# Patient Record
Sex: Female | Born: 1975 | Hispanic: No | Marital: Married
Health system: Southern US, Community
[De-identification: ages and names within clinical notes are randomized; demographics above are authoritative.]

---

## 2016-07-10 ENCOUNTER — Other Ambulatory Visit (HOSPITAL_COMMUNITY)
Admission: RE | Admit: 2016-07-10 | Discharge: 2016-07-10 | Disposition: A | Discharge status: Home / Self Care | Payer: 59 | Source: Ambulatory Visit | Attending: Obstetrics & Gynecology | Admitting: Obstetrics & Gynecology

## 2016-07-10 ENCOUNTER — Other Ambulatory Visit: Payer: Self-pay | Admitting: Obstetrics & Gynecology

## 2016-07-10 DIAGNOSIS — Z01419 Encounter for gynecological examination (general) (routine) without abnormal findings: Secondary | ICD-10-CM | POA: Diagnosis present

## 2016-07-10 DIAGNOSIS — Z1151 Encounter for screening for human papillomavirus (HPV): Secondary | ICD-10-CM | POA: Diagnosis not present

## 2016-07-11 ENCOUNTER — Ambulatory Visit: Payer: Self-pay | Admitting: Physician Assistant

## 2016-07-15 LAB — CYTOLOGY - PAP
DIAGNOSIS: NEGATIVE
HPV: NOT DETECTED

## 2017-05-27 ENCOUNTER — Encounter: Payer: Self-pay | Admitting: Physician Assistant

## 2019-03-17 ENCOUNTER — Other Ambulatory Visit: Payer: Self-pay | Admitting: Gastroenterology

## 2019-03-17 DIAGNOSIS — K529 Noninfective gastroenteritis and colitis, unspecified: Secondary | ICD-10-CM

## 2019-03-17 DIAGNOSIS — R140 Abdominal distension (gaseous): Secondary | ICD-10-CM

## 2019-03-17 DIAGNOSIS — R634 Abnormal weight loss: Secondary | ICD-10-CM

## 2019-03-17 DIAGNOSIS — R1010 Upper abdominal pain, unspecified: Secondary | ICD-10-CM

## 2019-03-24 ENCOUNTER — Ambulatory Visit
Admission: RE | Admit: 2019-03-24 | Discharge: 2019-03-24 | Disposition: A | Discharge status: Home / Self Care | Payer: Managed Care, Other (non HMO) | Source: Ambulatory Visit | Attending: Gastroenterology | Admitting: Gastroenterology

## 2019-03-24 DIAGNOSIS — K529 Noninfective gastroenteritis and colitis, unspecified: Secondary | ICD-10-CM

## 2019-03-24 DIAGNOSIS — R140 Abdominal distension (gaseous): Secondary | ICD-10-CM

## 2019-03-24 DIAGNOSIS — R1010 Upper abdominal pain, unspecified: Secondary | ICD-10-CM

## 2019-03-24 DIAGNOSIS — R634 Abnormal weight loss: Secondary | ICD-10-CM

## 2021-07-01 ENCOUNTER — Other Ambulatory Visit: Payer: Self-pay | Admitting: Family Medicine

## 2021-07-01 DIAGNOSIS — Z1231 Encounter for screening mammogram for malignant neoplasm of breast: Secondary | ICD-10-CM

## 2021-07-09 ENCOUNTER — Ambulatory Visit
Admission: RE | Admit: 2021-07-09 | Discharge: 2021-07-09 | Disposition: A | Discharge status: Home / Self Care | Payer: Managed Care, Other (non HMO) | Source: Ambulatory Visit | Attending: Family Medicine | Admitting: Family Medicine

## 2021-07-09 DIAGNOSIS — Z1231 Encounter for screening mammogram for malignant neoplasm of breast: Secondary | ICD-10-CM

## 2021-07-11 ENCOUNTER — Ambulatory Visit: Payer: Managed Care, Other (non HMO)

## 2021-09-19 ENCOUNTER — Ambulatory Visit: Payer: Managed Care, Other (non HMO) | Admitting: Orthopaedic Surgery

## 2021-09-19 ENCOUNTER — Ambulatory Visit: Payer: Self-pay

## 2021-09-19 ENCOUNTER — Ambulatory Visit (INDEPENDENT_AMBULATORY_CARE_PROVIDER_SITE_OTHER): Payer: Managed Care, Other (non HMO)

## 2021-09-19 DIAGNOSIS — M25561 Pain in right knee: Secondary | ICD-10-CM

## 2021-09-19 DIAGNOSIS — G8929 Other chronic pain: Secondary | ICD-10-CM | POA: Insufficient documentation

## 2021-09-19 DIAGNOSIS — M25521 Pain in right elbow: Secondary | ICD-10-CM | POA: Diagnosis not present

## 2021-09-19 DIAGNOSIS — M25562 Pain in left knee: Secondary | ICD-10-CM

## 2021-09-19 MED ORDER — BUPIVACAINE HCL 0.5 % IJ SOLN
2.0000 mL | INTRAMUSCULAR | Status: AC | PRN
  Administered 2021-09-19: 2 mL via INTRA_ARTICULAR

## 2021-09-19 MED ORDER — LIDOCAINE HCL 1 % IJ SOLN
2.0000 mL | INTRAMUSCULAR | Status: AC | PRN
  Administered 2021-09-19: 2 mL

## 2021-09-19 MED ORDER — METHYLPREDNISOLONE ACETATE 40 MG/ML IJ SUSP
40.0000 mg | INTRAMUSCULAR | Status: AC | PRN
  Administered 2021-09-19: 40 mg via INTRA_ARTICULAR

## 2021-09-19 NOTE — Progress Notes (Signed)
Office Visit Note   Patient: Donna Hendricks           Date of Birth: 03-30-1975           MRN: [id number] Visit Date: 09/19/2021              Requested by: Mila Palmer, MD [address] [place],  [place] [address] PCP: Mila Palmer, MD   Assessment & Plan: Visit Diagnoses:  1. Chronic pain of both knees   2. Pain in right elbow     Plan: Impression is left knee pain and right elbow lateral epicondylitis.  For the left knee suspect that she may have a medial meniscal tear.  Not having any mechanical symptoms at this time.  We will try cortisone injection and relative rest.  Will need MRI if symptoms do not improve.  In regards to the elbow Home exercises and bands were provided.  Follow-Up Instructions: No follow-ups on file.   Orders:  Orders Placed This Encounter  Procedures   Large Joint Inj: L knee   XR KNEE 3 VIEW LEFT   XR KNEE 3 VIEW RIGHT   No orders of the defined types were placed in this encounter.     Procedures: Large Joint Inj: L knee on 09/19/2021 9:47 AM Details: 22 G needle Medications: 2 mL bupivacaine 0.5 %; 2 mL lidocaine 1 %; 40 mg methylPREDNISolone acetate 40 MG/ML Outcome: tolerated well, no immediate complications Patient was prepped and draped in the usual sterile fashion.       Clinical Data: No additional findings.   Subjective: Chief Complaint  Patient presents with   Right Knee - Pain   Left Knee - Pain    HPI Patient is a very pleasant 46 year old female here for evaluation of her right elbow pain and bilateral knee pain.  The pain is worse in the left knee along the medial joint line.  She is very active and is an avid runner.  Has popping in both knees.  Pain started in June.  Her elbow hurts when lifting a 10 pound weight.  The pain is to the lateral side of the elbow.  Denies any numbness and tingling. Review of Systems  Constitutional: Negative.   HENT: Negative.    Eyes: Negative.   Respiratory:  Negative.    Cardiovascular: Negative.   Endocrine: Negative.   Musculoskeletal: Negative.   Neurological: Negative.   Hematological: Negative.   Psychiatric/Behavioral: Negative.    All other systems reviewed and are negative.    Objective: Vital Signs: There were no vitals taken for this visit.  Physical Exam Vitals and nursing note reviewed.  Constitutional:      Appearance: She is well-developed.  HENT:     Head: Atraumatic.     Nose: Nose normal.  Eyes:     Extraocular Movements: Extraocular movements intact.  Cardiovascular:     Pulses: Normal pulses.  Pulmonary:     Effort: Pulmonary effort is normal.  Abdominal:     Palpations: Abdomen is soft.  Musculoskeletal:     Cervical back: Neck supple.  Skin:    General: Skin is warm.     Capillary Refill: Capillary refill takes less than 2 seconds.  Neurological:     Mental Status: She is alert. Mental status is at baseline.  Psychiatric:        Behavior: Behavior normal.        Thought Content: Thought content normal.  Judgment: Judgment normal.     Ortho Exam Examination of bilateral knees show no joint effusion.  She has medial joint line tenderness to the left knee.  Collaterals and cruciates are stable.  Negative McMurray. Examination of right elbow shows point tenderness to the lateral epicondyle.  No significant pain with resisted wrist extension or extension of the long finger. Specialty Comments:  No specialty comments available.  Imaging: XR KNEE 3 VIEW LEFT  Result Date: 09/19/2021 Very mild osteoarthritis and periarticular spurring.  XR KNEE 3 VIEW RIGHT  Result Date: 09/19/2021 Mild osteoarthritis and periarticular spurring.    PMFS History: Patient Active Problem List   Diagnosis Date Noted   Pain in right elbow 09/19/2021   Chronic pain of both knees 09/19/2021   No past medical history on file.  No family history on file.  No past surgical history on file. Social History    Occupational History   Not on file  Tobacco Use   Smoking status: Not on file   Smokeless tobacco: Not on file  Substance and Sexual Activity   Alcohol use: Not on file   Drug use: Not on file   Sexual activity: Not on file

## 2021-12-12 ENCOUNTER — Ambulatory Visit (INDEPENDENT_AMBULATORY_CARE_PROVIDER_SITE_OTHER): Payer: Managed Care, Other (non HMO) | Admitting: Sports Medicine

## 2021-12-12 DIAGNOSIS — G8929 Other chronic pain: Secondary | ICD-10-CM

## 2021-12-12 DIAGNOSIS — M25561 Pain in right knee: Secondary | ICD-10-CM | POA: Diagnosis not present

## 2021-12-12 DIAGNOSIS — M25562 Pain in left knee: Secondary | ICD-10-CM | POA: Diagnosis not present

## 2021-12-12 NOTE — Patient Instructions (Signed)
It was wonderful to meet you today. Thank you for allowing me to be a part of your care. Below is a short summary of what we discussed at your visit today:  Your knee pain could be related to your form with running.  You have mild pronation with running  I recommend continuing your knee strengthening exercise.  We have provided you with green sports insert with scaffold  pads to support your knees  Also you should take a break from running for 2 weeks and at this time you can only run an elliptical.  After 2 weeks break you can have a gradual return to routine running  Follow-up in 3-4 weeks for revaluation.   Please bring all of your medications to every appointment!  If you have any questions or concerns, please do not hesitate to contact us via phone or MyChart message.   Alen Bleacher, MD [facility]

## 2021-12-12 NOTE — Progress Notes (Signed)
    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  Denies any recent trauma to the knee.  Describes pain as pain on the medial aspect of the knee bilaterally but is worse on the left.  Pain is intermittent mostly with running at a fast pace.  Denies locking, catching or buckling of the knee.  Does occassionally have some popping sound.  Reported only 1 time swellings on her knee after running and that was the start of her knee pain.  He has tried ice pack, strengthening exercise and knee bands which provided relieve. She has gotten cortisone shot that provided mild relieve as well.  PERTINENT  PMH / PSH: Reviewed  OBJECTIVE:   BP 124/70   Ht 5\' 2"  (1.575 m)   Wt 133 lb (60.3 kg)   BMI 24.33 kg/m      Physical Exam  General: Alert, well appearing, NAD  Left Knee Exam No effusion.  No other gross deformity, ecchymoses. TTP medial joint line. FROM with normal strength. Negative ant/post drawers. Negative valgus/varus testing. Negative lachman. Negative mcmurrays, thessalys. NV intact distally.  ASSESSMENT/PLAN:   Knee pain likely 2/2 to running form. On exam found to have mild pronation with running. Recent Xray showed mild arthritis but less likely the cause of her pain. Provided patient with green sports inserts with scaffold pads.  Recommend relative rest from running, could do only elliptical for 2 weeks and then gradual return to running.  Provided patient with work note to allow tennis shoe.  Continue knee strengthening exercise.  Follow-up in 3-4 weeks for reevaluation.    Alen Bleacher, MD [facility]   Patient seen and evaluated with the resident.  I agree with the above plan of care.  Patient's knee pain is present only with running, specifically with faster running.  We will try some green sports insoles and scaphoid  pads as she does have a mild amount of pronation when running.  I think she should refrain from running for 2 weeks and instead cross train on a bike or an elliptical.  She may then resume running 2 weeks before follow-up with me.  We did discuss merits of MRI if symptoms persist but her history and physical exam do not suggest any surgical meniscal pathology currently.  This note was dictated using Dragon naturally speaking software and may contain errors in syntax, spelling, or content which have not been identified prior to signing this note.

## 2022-01-09 ENCOUNTER — Ambulatory Visit (INDEPENDENT_AMBULATORY_CARE_PROVIDER_SITE_OTHER): Payer: Managed Care, Other (non HMO) | Admitting: Sports Medicine

## 2022-01-09 VITALS — BP 122/82 | Ht 62.0 in | Wt 133.0 lb

## 2022-01-09 DIAGNOSIS — G8929 Other chronic pain: Secondary | ICD-10-CM | POA: Diagnosis not present

## 2022-01-09 DIAGNOSIS — M25561 Pain in right knee: Secondary | ICD-10-CM

## 2022-01-09 DIAGNOSIS — M25562 Pain in left knee: Secondary | ICD-10-CM

## 2022-01-09 NOTE — Assessment & Plan Note (Addendum)
Steadily improving with PT, inserts, conservative management, slow increase in activity.  Increase activity by 10% each week.  Continue to recommend neutral running shoe with insert.  Continue treatment plan with consideration for custom orthotics at 1 month follow-up if continuing to improve.

## 2022-01-09 NOTE — Progress Notes (Signed)
  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  She notes she has been able to slowly get back to running/walking in which she was able to tolerate 4 miles several days ago and 3 miles yesterday.  She has continued to use her inserts in addition to icing and the Thera gun.  The inserts have "been life-changing".  She is still undergoing physical therapy and has made improvement on that front as well.  PERTINENT  PMH / PSH: N/A  OBJECTIVE:  BP 122/82   Ht 5\' 2"  (1.575 m)   Wt 133 lb (60.3 kg)   BMI 24.33 kg/m  Bilateral knees: No gross deformity, ecchymoses, swelling. No TTP. FROM with normal strength. Negative ant/post drawers. Negative valgus/varus testing. Negative lachman.  Negative mcmurrays.   ASSESSMENT/PLAN:  Chronic pain of both knees Assessment & Plan: Steadily improving with PT, inserts, conservative management, slow increase in activity.  Increase activity by 10% each week.  Continue to recommend neutral running shoe with insert.  Continue treatment plan with consideration for custom orthotics at 1 month follow-up if continuing to improve.   , DO 01/09/2022, 9:11 AM PGY-2, [facility]  Patient seen and evaluated with the resident.  I agree with the above plan of care.  Patient is improving with physical therapy and with green sports insoles with scaphoid pads.  Continue with physical therapy until ready for discharge to home exercise program.  We discussed the proper way to increase volume of running as well as proceeding with custom orthotics.  She will follow-up with me again in 4 weeks and if she continues to improve then we will plan on custom orthotics at that visit.  This note was dictated using Dragon naturally speaking software and may contain errors in syntax, spelling, or content which have not been identified prior to  signing this note.

## 2022-02-06 ENCOUNTER — Ambulatory Visit (INDEPENDENT_AMBULATORY_CARE_PROVIDER_SITE_OTHER): Payer: Managed Care, Other (non HMO) | Admitting: Sports Medicine

## 2022-02-06 VITALS — BP 114/80 | Ht 62.0 in | Wt 135.0 lb

## 2022-02-06 DIAGNOSIS — M25561 Pain in right knee: Secondary | ICD-10-CM

## 2022-02-06 DIAGNOSIS — G8929 Other chronic pain: Secondary | ICD-10-CM

## 2022-02-06 DIAGNOSIS — M216X1 Other acquired deformities of right foot: Secondary | ICD-10-CM

## 2022-02-06 DIAGNOSIS — M216X2 Other acquired deformities of left foot: Secondary | ICD-10-CM

## 2022-02-06 DIAGNOSIS — M25562 Pain in left knee: Secondary | ICD-10-CM

## 2022-02-06 NOTE — Progress Notes (Signed)
   Subjective:    Patient ID: Donna Hendricks, female    DOB: [birth year], 46 y.o.   MRN: [id number]  HPI  Patient presents today for custom orthotics.  She has completed physical therapy.  Unfortunately, she suffered an injury to the left knee on Thanksgiving.  A dog ran into her knee and she has had pain and swelling since then.  She would like to return to physical therapy if possible.   Review of Systems As above    Objective:   Physical Exam  Well-developed, well-nourished.  No acute distress  Left knee: Full range of motion.  Trace effusion.  There is slight tenderness to palpation along the medial joint line but a negative McMurray's.  Knee is stable to valgus and varus stressing.  Negative anterior drawer, negative posterior drawer.  Negative Lachman's.  Neurovascularly intact distally.  Walking without a limp.      Assessment & Plan:   Acute on chronic left knee pain Pronation  Custom orthotics were created as below.  A new prescription for physical therapy was also provided.  If left knee pain and swelling persist despite return to physical therapy the patient will return to the office for reevaluation and imaging at that time.  Follow-up as needed.  Patient was fitted for a : standard, cushioned, semi-rigid orthotic. The orthotic was heated and afterward the patient stood on the orthotic blank positioned on the orthotic stand. The patient was positioned in subtalar neutral position and 10 degrees of ankle dorsiflexion in a weight bearing stance. After completion of molding, a stable base was applied to the orthotic blank. The blank was ground to a stable position for weight bearing. Size: 7 Base: Blue EVA Posting: None Additional orthotic padding: None  This note was dictated using Dragon naturally speaking software and may contain errors in syntax, spelling, or content which have not been identified prior to signing this note.

## 2022-03-06 ENCOUNTER — Ambulatory Visit (INDEPENDENT_AMBULATORY_CARE_PROVIDER_SITE_OTHER): Payer: Managed Care, Other (non HMO) | Admitting: Sports Medicine

## 2022-03-06 VITALS — BP 130/70 | Ht 62.0 in | Wt 135.0 lb

## 2022-03-06 DIAGNOSIS — M2242 Chondromalacia patellae, left knee: Secondary | ICD-10-CM

## 2022-03-06 NOTE — Progress Notes (Signed)
   Subjective:    Patient ID: Donna Hendricks, female    DOB: [birth year], 47 y.o.   MRN: [id number]  HPI  Donna Hendricks returns today for follow-up on left knee pain.  Overall, she is doing very well.  She has had several sessions with a physical therapist and has noticed benefit from those.  She was able to return to some light running yesterday without pain.  She does still endorse intermittent pain along the inferior medial patella but it is intermittent and does not last long.  She has been applying topical Bengay which has been helpful.  She also endorses a feeling of swelling in the knee.  No mechanical symptoms.  She has found her new custom orthotics to be comfortable and helpful.   Review of Systems As above    Objective:   Physical Exam  Well-developed, well-nourished.  No acute distress  Left knee: Full range of motion.  2+ patellofemoral crepitus.  Knee is stable to valgus and varus stressing.  Negative McMurray's.  Neurovascular intact distally.  Walking without a noticeable limp.      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  She may continue with her topical Bengay.  She will start to increase her activity, including running, as her symptoms allow.  I did explain to Donna Hendricks that if her symptoms once again worsen then I would consider imaging in the form of x-ray and MRI at that time.  Otherwise, she will follow-up as needed.  This note was dictated using Dragon naturally speaking software and may contain errors in syntax, spelling, or content which have not been identified prior to signing this note.

## 2022-05-28 ENCOUNTER — Other Ambulatory Visit: Payer: Self-pay | Admitting: Sports Medicine

## 2022-05-28 DIAGNOSIS — M25562 Pain in left knee: Secondary | ICD-10-CM

## 2022-12-16 IMAGING — MG MM DIGITAL SCREENING BILAT W/ TOMO AND CAD
8 series · 9 of 24 positions shown · non-contrast
Comparison: None available.

CLINICAL DATA: Screening.

EXAM:
DIGITAL SCREENING BILATERAL MAMMOGRAM WITH TOMOSYNTHESIS AND CAD
TECHNIQUE: Bilateral screening digital craniocaudal and mediolateral oblique
mammograms were obtained. Bilateral screening digital breast
tomosynthesis was performed. The images were evaluated with
computer-aided detection.

[R CC synth-2D]
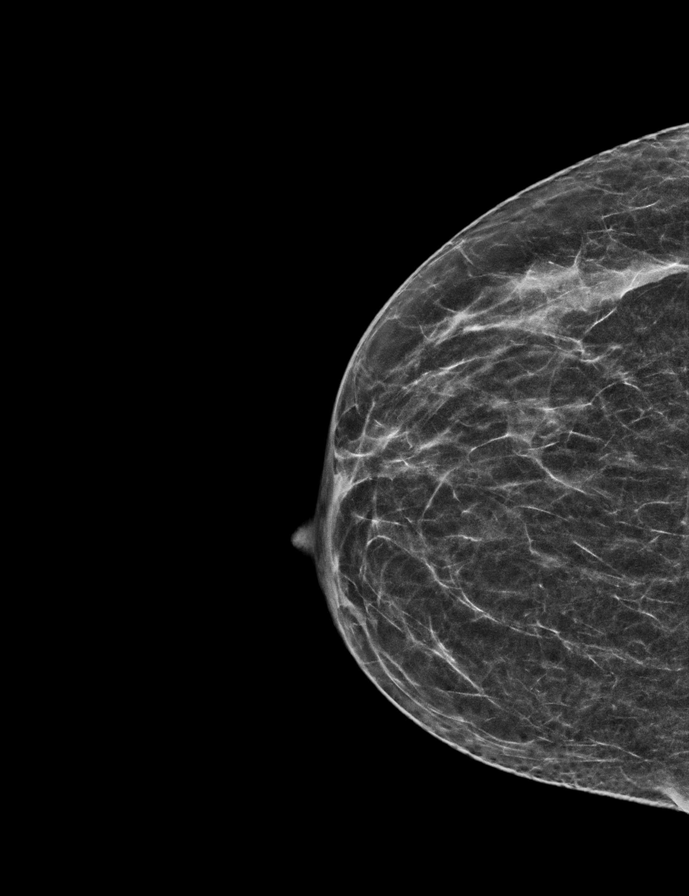

[L MLO synth-2D]
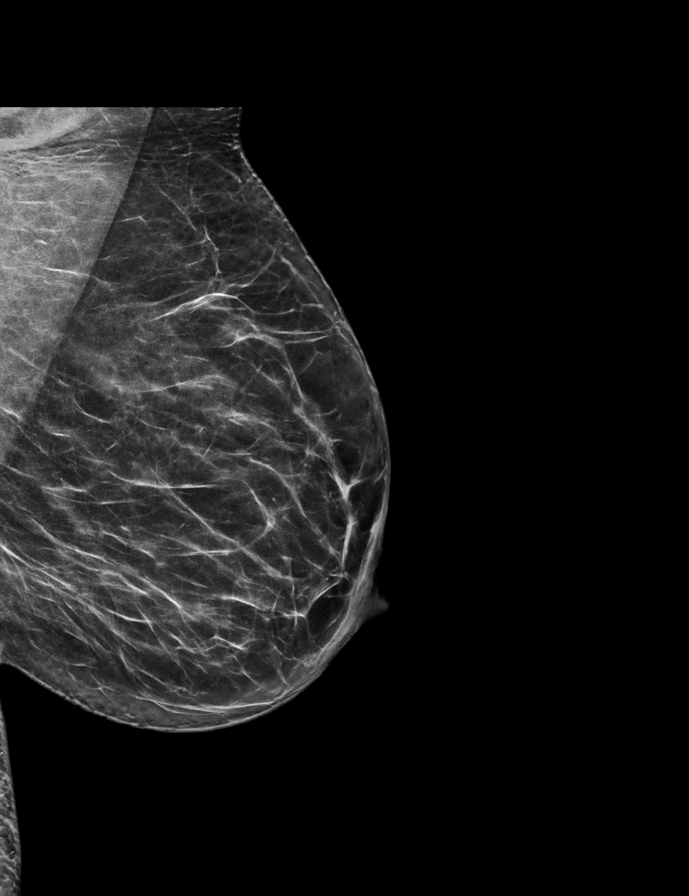

[R MLO synth-2D]
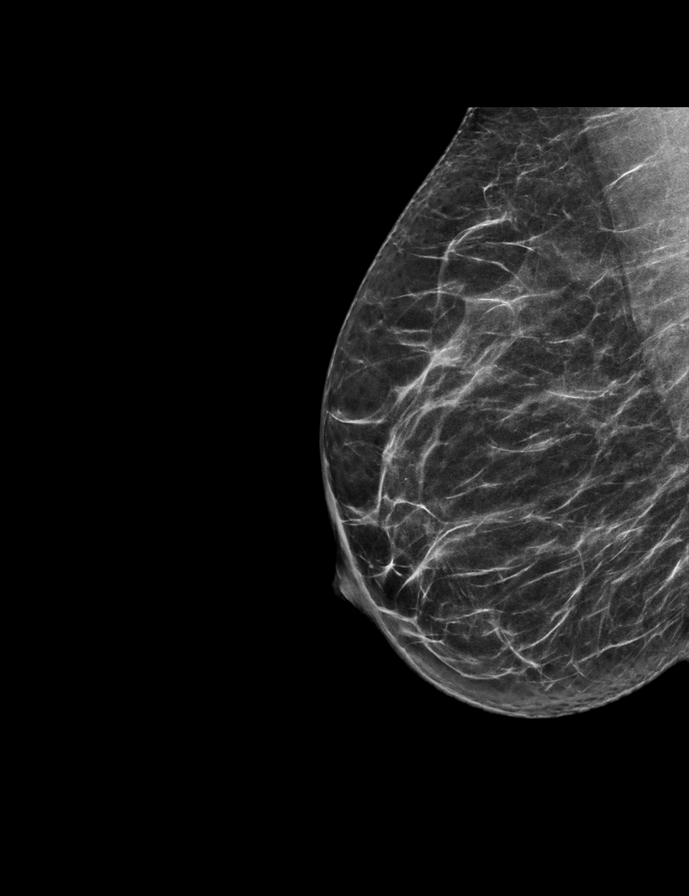

[L CC synth-2D]
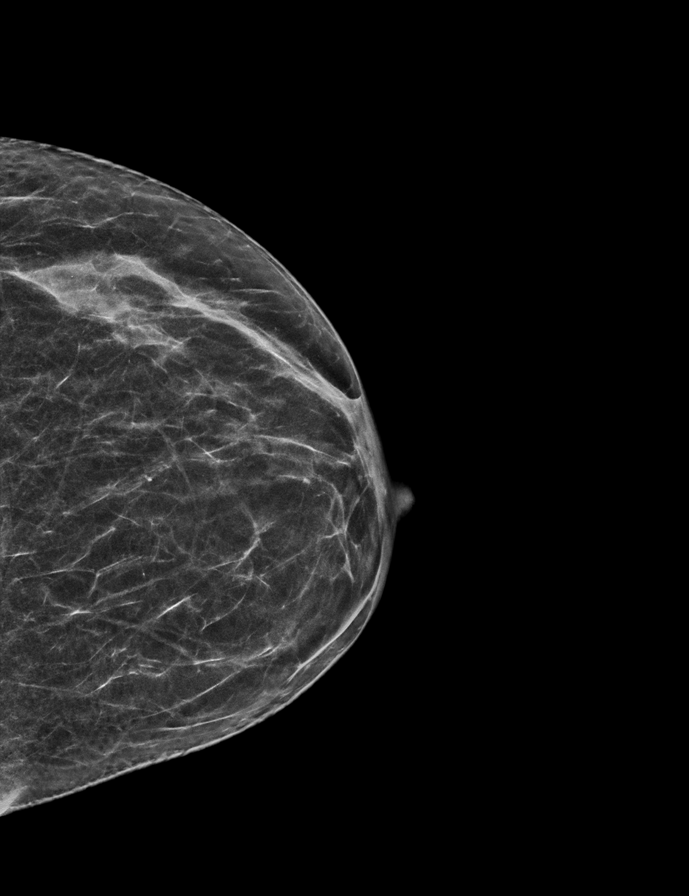

[L MLO tomo · 2 of 64 frames shown]
[frame 21/64]
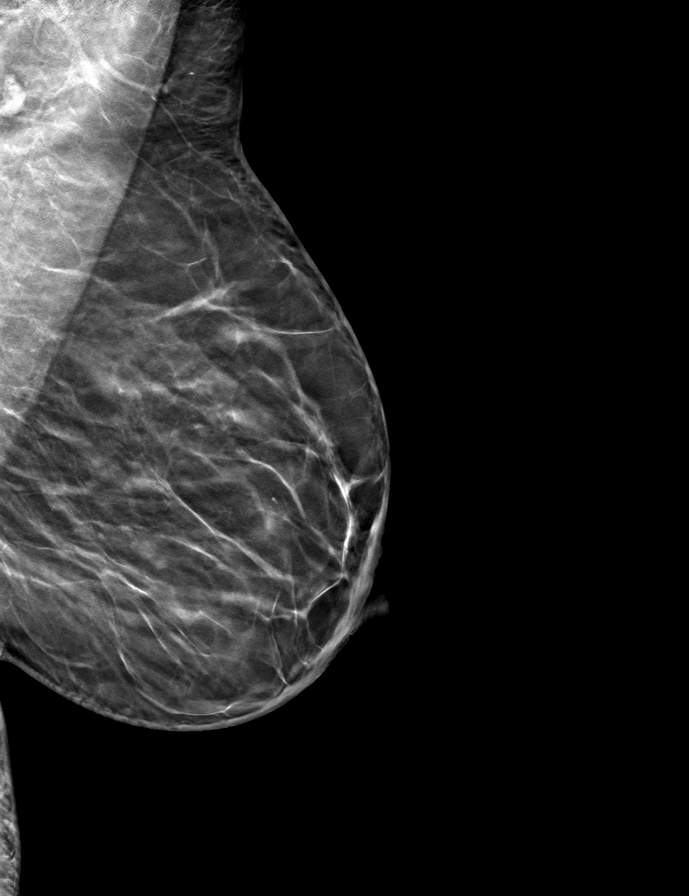
[frame 33/64]
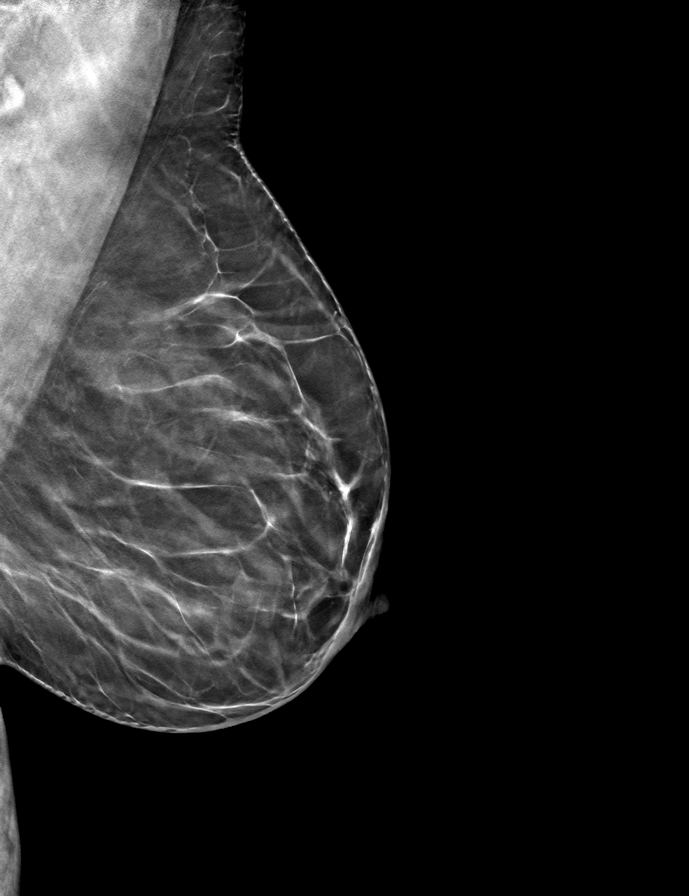

[R CC tomo · tomo slice 25/49.0]
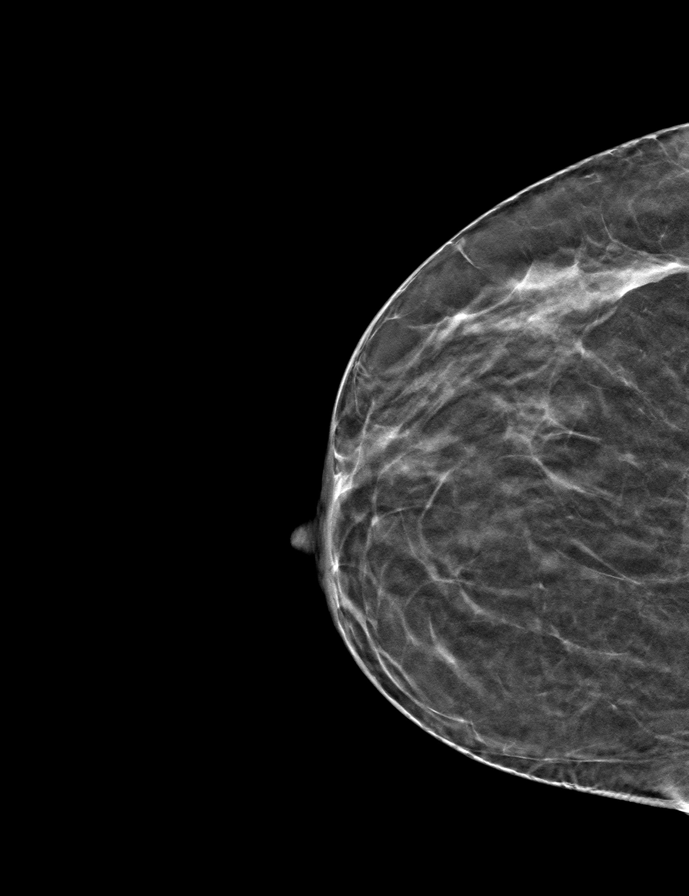

[R MLO tomo · tomo slice 30/59.0]
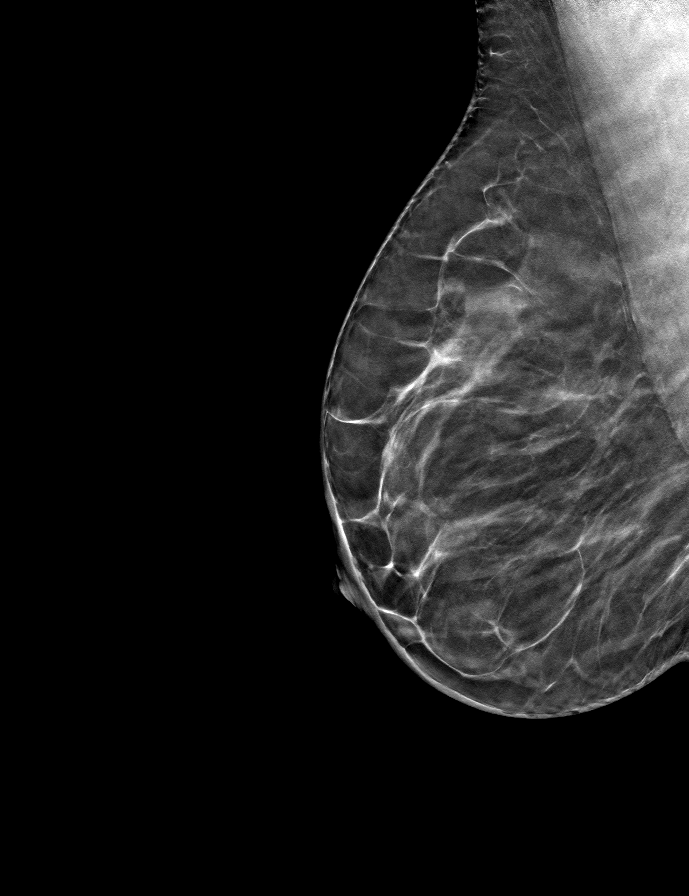

[L CC tomo · tomo slice 26/51.0]
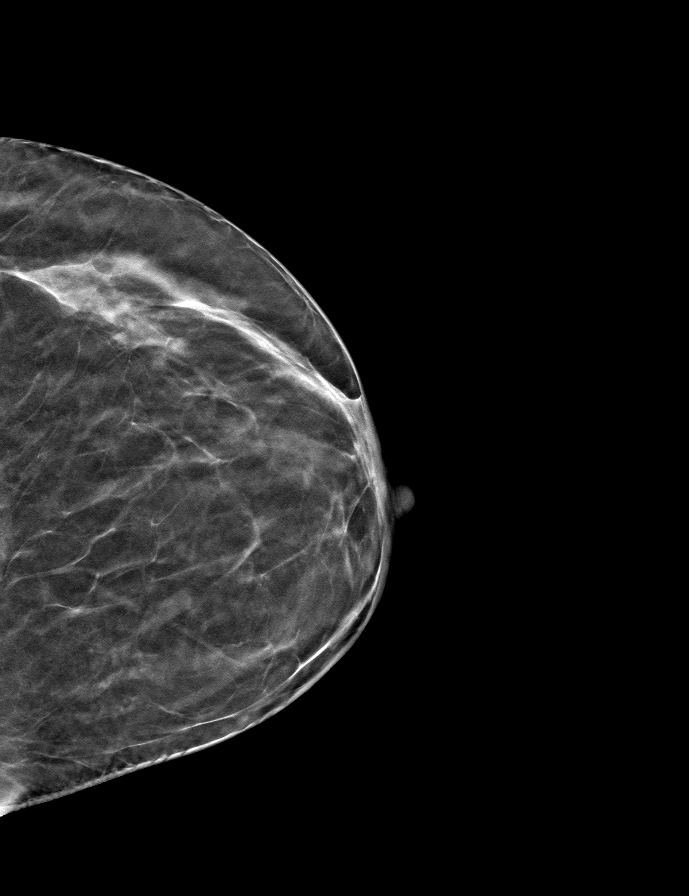

[9 of 24 positions shown; findings below may reference images not displayed]

ACR Breast Density Category b: There are scattered areas of
fibroglandular density.
FINDINGS: There are no findings suspicious for malignancy.
IMPRESSION: No mammographic evidence of malignancy. A result letter of this
screening mammogram will be mailed directly to the patient.

RECOMMENDATION:
Screening mammogram in one year. (Code:GB-Z-FIU)

BI-RADS CATEGORY  1: Negative.

## 2023-01-13 ENCOUNTER — Other Ambulatory Visit: Payer: Self-pay | Admitting: Family Medicine

## 2023-01-13 DIAGNOSIS — Z1231 Encounter for screening mammogram for malignant neoplasm of breast: Secondary | ICD-10-CM

## 2023-02-11 ENCOUNTER — Ambulatory Visit
Admission: RE | Admit: 2023-02-11 | Discharge: 2023-02-11 | Disposition: A | Discharge status: Home / Self Care | Payer: Managed Care, Other (non HMO) | Source: Ambulatory Visit | Attending: Family Medicine | Admitting: Family Medicine

## 2023-02-11 DIAGNOSIS — Z1231 Encounter for screening mammogram for malignant neoplasm of breast: Secondary | ICD-10-CM
